# Patient Record
Sex: Female | Born: 2000 | Race: Black or African American | Hispanic: No | Marital: Single | Smoking: Never smoker
Health system: Southern US, Community
[De-identification: ages and names within clinical notes are randomized; demographics above are authoritative.]

## PROBLEM LIST (undated history)

## (undated) DIAGNOSIS — L309 Dermatitis, unspecified: Secondary | ICD-10-CM

---
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
## 2019-04-15 ENCOUNTER — Encounter (HOSPITAL_BASED_OUTPATIENT_CLINIC_OR_DEPARTMENT_OTHER): Payer: Self-pay | Admitting: *Deleted

## 2019-04-15 ENCOUNTER — Emergency Department (HOSPITAL_BASED_OUTPATIENT_CLINIC_OR_DEPARTMENT_OTHER)
Admission: EM | Admit: 2019-04-15 | Discharge: 2019-04-15 | Disposition: A | Discharge status: Home / Self Care | Payer: Medicaid Other | Attending: Emergency Medicine | Admitting: Emergency Medicine

## 2019-04-15 ENCOUNTER — Other Ambulatory Visit: Payer: Self-pay

## 2019-04-15 DIAGNOSIS — M545 Low back pain, unspecified: Secondary | ICD-10-CM

## 2019-04-15 LAB — PREGNANCY, URINE: Preg Test, Ur: NEGATIVE

## 2019-04-15 MED ORDER — CYCLOBENZAPRINE HCL 10 MG PO TABS: 10.0000 mg | ORAL_TABLET | Freq: Two times a day (BID) | ORAL | 0 refills | Status: DC | PRN

## 2019-04-15 NOTE — ED Provider Notes (Signed)
[facility] EMERGENCY DEPARTMENT Provider Note   CSN: [id number] Arrival date & time: 04/15/19  2128     History   Chief Complaint Chief Complaint  Patient presents with  . Back Pain    HPI Nina Monroe is a 18 y.o. female.     The history is provided by the patient.  Back Pain Location:  Lumbar spine Quality:  Aching Radiates to:  Does not radiate Pain severity:  Mild Onset quality:  Gradual Timing:  Intermittent Progression:  Waxing and waning Context: physical stress   Relieved by:  Nothing Worsened by:  Nothing Associated symptoms: no abdominal pain, no abdominal swelling, no bladder incontinence, no bowel incontinence, no chest pain, no dysuria, no fever, no headaches, no leg pain, no perianal numbness, no tingling and no weakness     History reviewed. No pertinent past medical history.  There are no active problems to display for this patient.   History reviewed. No pertinent surgical history.   OB History   No obstetric history on file.      Home Medications    Prior to Admission medications   Medication Sig Start Date End Date Taking? Authorizing Provider  cyclobenzaprine (FLEXERIL) 10 MG tablet Take 1 tablet (10 mg total) by mouth 2 (two) times daily as needed for up to 10 doses for muscle spasms. 04/15/19   Lennice Sites, DO    Family History History reviewed. No pertinent family history.  Social History Social History   Tobacco Use  . Smoking status: Never Smoker  . Smokeless tobacco: Never Used  Substance Use Topics  . Alcohol use: No  . Drug use: No     Allergies   Patient has no known allergies.   Review of Systems Review of Systems  Constitutional: Negative for chills and fever.  HENT: Negative for ear pain and sore throat.   Eyes: Negative for pain and visual disturbance.  Respiratory: Negative for cough and shortness of breath.   Cardiovascular: Negative for chest pain and palpitations.  Gastrointestinal:  Negative for abdominal pain, bowel incontinence and vomiting.  Genitourinary: Negative for bladder incontinence, dysuria and hematuria.  Musculoskeletal: Positive for back pain. Negative for arthralgias.  Skin: Negative for color change and rash.  Neurological: Negative for tingling, seizures, syncope, weakness and headaches.  All other systems reviewed and are negative.    Physical Exam Updated Vital Signs BP (!) 142/86   Pulse 79   Temp 98.4 F (36.9 C) (Oral)   Resp 18   Ht 5\' 3"  (1.6 m)   Wt 72.6 kg   LMP 03/22/2019   SpO2 100%   BMI 28.34 kg/m   Physical Exam Vitals signs and nursing note reviewed.  Constitutional:      General: She is not in acute distress.    Appearance: She is well-developed.  HENT:     Head: Normocephalic and atraumatic.  Eyes:     Conjunctiva/sclera: Conjunctivae normal.  Neck:     Musculoskeletal: Normal range of motion and neck supple. Muscular tenderness present.  Cardiovascular:     Rate and Rhythm: Normal rate and regular rhythm.     Pulses: Normal pulses.     Heart sounds: Normal heart sounds. No murmur.  Pulmonary:     Effort: Pulmonary effort is normal. No respiratory distress.     Breath sounds: Normal breath sounds.  Abdominal:     Palpations: Abdomen is soft.     Tenderness: There is no abdominal tenderness.  Musculoskeletal: Normal range  of motion.        General: Tenderness present.     Comments: Paraspinal lumbar tenderness bilaterally, no midline spinal pain.  Skin:    General: Skin is warm and dry.  Neurological:     General: No focal deficit present.     Mental Status: She is alert and oriented to person, place, and time.     Cranial Nerves: No cranial nerve deficit.     Sensory: No sensory deficit.     Motor: No weakness.     Coordination: Coordination normal.     Gait: Gait normal.     Comments: 5+ out of 5 strength throughout, normal sensation      ED Treatments / Results  Labs (all labs ordered are listed,  but only abnormal results are displayed) Labs Reviewed  PREGNANCY, URINE    EKG None  Radiology No results found.  Procedures Procedures (including critical care time)  Medications Ordered in ED Medications - No data to display   Initial Impression / Assessment and Plan / ED Course  I have reviewed the triage vital signs and the nursing notes.  Pertinent labs & imaging results that were available during my care of the patient were reviewed by me and considered in my medical decision making (see chart for details).        Nina Monroe is a 18 year old female here for low back pain.  Patient with no red flag signs.  No midline spinal pain.  No loss of bowel or bladder.  No saddle anesthesia.  Patient with paraspinal lumbar tenderness.  Likely spasm.  Discharged from ED in good condition.  Recommend Tylenol, Motrin, Flexeril.  Pregnancy test negative.  This chart was dictated using voice recognition software.  Despite best efforts to proofread,  errors can occur which can change the documentation meaning.    Final Clinical Impressions(s) / ED Diagnoses   Final diagnoses:  Low back pain, unspecified back pain laterality, unspecified chronicity, unspecified whether sciatica present    ED Discharge Orders         Ordered    cyclobenzaprine (FLEXERIL) 10 MG tablet  2 times daily PRN     04/15/19 2208           Virgina NorfolkCuratolo, Tiaria Biby, DO 04/15/19 2211

## 2019-04-15 NOTE — ED Triage Notes (Signed)
Pt c/o fall with lower back pain x 1 day , also tested for Covid yesterday w/o symptoms

## 2019-04-22 ENCOUNTER — Other Ambulatory Visit: Payer: Self-pay

## 2019-04-22 ENCOUNTER — Emergency Department (HOSPITAL_COMMUNITY)
Admission: EM | Admit: 2019-04-22 | Discharge: 2019-04-22 | Disposition: A | Discharge status: Home / Self Care | Payer: Medicaid Other | Attending: Emergency Medicine | Admitting: Emergency Medicine

## 2019-04-22 ENCOUNTER — Encounter (HOSPITAL_COMMUNITY): Payer: Self-pay

## 2019-04-22 DIAGNOSIS — L03114 Cellulitis of left upper limb: Secondary | ICD-10-CM

## 2019-04-22 DIAGNOSIS — L2082 Flexural eczema: Secondary | ICD-10-CM | POA: Insufficient documentation

## 2019-04-22 MED ORDER — CEPHALEXIN 250 MG PO CAPS
500.0000 mg | ORAL_CAPSULE | Freq: Once | ORAL | Status: AC
  Administered 2019-04-22: 500 mg via ORAL
  Filled 2019-04-22: qty 2

## 2019-04-22 MED ORDER — DEXAMETHASONE SODIUM PHOSPHATE 10 MG/ML IJ SOLN
10.0000 mg | Freq: Once | INTRAMUSCULAR | Status: AC
  Administered 2019-04-22: 10 mg via INTRAMUSCULAR
  Filled 2019-04-22: qty 1

## 2019-04-22 MED ORDER — CEPHALEXIN 500 MG PO CAPS: 500.0000 mg | ORAL_CAPSULE | Freq: Two times a day (BID) | ORAL | 0 refills | Status: DC

## 2019-04-22 NOTE — ED Triage Notes (Signed)
Pt has had several abscess in the left AC, reports drainage, denies IV drug use, denies fevers

## 2019-04-22 NOTE — ED Notes (Signed)
Pt refused discharge signature. 

## 2019-04-22 NOTE — ED Provider Notes (Signed)
[facility] Provider Note   CSN: [id number] Arrival date & time: 04/22/19  0005     History   Chief Complaint Chief Complaint  Patient presents with  . Abscess    HPI Nina Monroe is a 18 y.o. female.     Patient presents to the emergency department with a chief complaint of rash.  She states that she noticed a rash on her left arm few days ago.  She has developed some sores which are painful.  She does have a history of eczema.  She reports that she has been using hydrocortisone cream with no relief.  She denies any fevers or chills.  Denies any injection drug use.  Denies any other associated symptoms.  The history is provided by the patient. No language interpreter was used.    History reviewed. No pertinent past medical history.  There are no active problems to display for this patient.   History reviewed. No pertinent surgical history.   OB History   No obstetric history on file.      Home Medications    Prior to Admission medications   Medication Sig Start Date End Date Taking? Authorizing Provider  cephALEXin (KEFLEX) 500 MG capsule Take 1 capsule (500 mg total) by mouth 2 (two) times daily. 04/22/19   Roxy HorsemanBrowning, Zamyra Allensworth, PA-C  cyclobenzaprine (FLEXERIL) 10 MG tablet Take 1 tablet (10 mg total) by mouth 2 (two) times daily as needed for up to 10 doses for muscle spasms. 04/15/19   Virgina Norfolkuratolo, Adam, DO    Family History No family history on file.  Social History Social History   Tobacco Use  . Smoking status: Never Smoker  . Smokeless tobacco: Never Used  Substance Use Topics  . Alcohol use: No  . Drug use: No     Allergies   Patient has no known allergies.   Review of Systems Review of Systems  All other systems reviewed and are negative.    Physical Exam Updated Vital Signs BP 128/86   Pulse 91   Temp 98.6 F (37 C) (Oral)   Resp 14   Ht 5\' 2"  (1.575 m)   Wt 68 kg   SpO2 100%   BMI 27.44 kg/m    Physical Exam Vitals signs and nursing note reviewed.  Constitutional:      General: She is not in acute distress.    Appearance: She is well-developed.  HENT:     Head: Normocephalic and atraumatic.  Eyes:     Conjunctiva/sclera: Conjunctivae normal.  Neck:     Musculoskeletal: Neck supple.  Cardiovascular:     Rate and Rhythm: Normal rate.     Heart sounds: No murmur.  Pulmonary:     Effort: Pulmonary effort is normal. No respiratory distress.  Abdominal:     General: There is no distension.  Skin:    General: Skin is warm and dry.     Comments: Eczema with mild superficial infection to the left antecubital fossa, no abscess  Neurological:     Mental Status: She is alert.  Psychiatric:        Mood and Affect: Mood normal.        Behavior: Behavior normal.        Thought Content: Thought content normal.        Judgment: Judgment normal.      ED Treatments / Results  Labs (all labs ordered are listed, but only abnormal results are displayed) Labs Reviewed - No data  to display  EKG None  Radiology No results found.  Procedures Procedures (including critical care time)  Medications Ordered in ED Medications  dexamethasone (DECADRON) injection 10 mg (has no administration in time range)  cephALEXin (KEFLEX) capsule 500 mg (has no administration in time range)     Initial Impression / Assessment and Plan / ED Course  I have reviewed the triage vital signs and the nursing notes.  Pertinent labs & imaging results that were available during my care of the patient were reviewed by me and considered in my medical decision making (see chart for details).        Patient with eczema and mild superficial infection.  No abscess.  Will treat with Keflex.  Will give Decadron to hopefully knock down the eczema.  Final Clinical Impressions(s) / ED Diagnoses   Final diagnoses:  Flexural eczema  Cellulitis of left upper extremity    ED Discharge Orders          Ordered    cephALEXin (KEFLEX) 500 MG capsule  2 times daily     04/22/19 0055           Montine Circle, PA-C 04/22/19 0057    Palumbo, April, MD 04/22/19 5027

## 2019-04-22 NOTE — ED Notes (Signed)
Pt discharged with all belongings. Discharge instructions reviewed with pt, and pt verbalized understanding. Opportunity for questions provided.  

## 2019-05-07 ENCOUNTER — Emergency Department (HOSPITAL_COMMUNITY)
Admission: EM | Admit: 2019-05-07 | Discharge: 2019-05-07 | Disposition: A | Discharge status: Home / Self Care | Payer: Medicaid Other | Attending: Emergency Medicine | Admitting: Emergency Medicine

## 2019-05-07 ENCOUNTER — Other Ambulatory Visit: Payer: Self-pay

## 2019-05-07 DIAGNOSIS — Z20828 Contact with and (suspected) exposure to other viral communicable diseases: Secondary | ICD-10-CM | POA: Insufficient documentation

## 2019-05-07 DIAGNOSIS — J069 Acute upper respiratory infection, unspecified: Secondary | ICD-10-CM | POA: Insufficient documentation

## 2019-05-07 MED ORDER — ONDANSETRON 4 MG PO TBDP: 4.0000 mg | ORAL_TABLET | Freq: Three times a day (TID) | ORAL | 0 refills | Status: DC | PRN

## 2019-05-07 NOTE — ED Provider Notes (Signed)
[facility]-EMERGENCY DEPT Provider Note   CSN: [id number] Arrival date & time: 05/07/19  1259     History   Chief Complaint Chief Complaint  Patient presents with  . Cough  . Sore Throat  . Diarrhea  . Nausea  . Emesis    HPI Nina Monroe is a 18 y.o. female with no significant PMH who presents with sore throat, nausea, and diarrhea.  She says that her symptoms started about 5 days ago.  [facility] and was given amoxicillin for presumed strep throat.  She says that her symptoms have not improved despite taking the amoxicillin.  She has postnasal drip, occasional cough productive of phlegm, sore throat, some nausea and vomiting, and diarrhea, which she describes as having 2 loose stools daily.  She denies fever and shortness of breath.  She has no known sick contacts, including no known exposure to COVID.     HPI  No past medical history on file.  There are no active problems to display for this patient.   No past surgical history on file.   OB History   No obstetric history on file.      Home Medications    Prior to Admission medications   Medication Sig Start Date End Date Taking? Authorizing Provider  cephALEXin (KEFLEX) 500 MG capsule Take 1 capsule (500 mg total) by mouth 2 (two) times daily. 04/22/19   Roxy HorsemanBrowning, Robert, PA-C  cyclobenzaprine (FLEXERIL) 10 MG tablet Take 1 tablet (10 mg total) by mouth 2 (two) times daily as needed for up to 10 doses for muscle spasms. 04/15/19   Curatolo, Adam, DO  ondansetron (ZOFRAN ODT) 4 MG disintegrating tablet Take 1 tablet (4 mg total) by mouth every 8 (eight) hours as needed for nausea or vomiting. 05/07/19   Winfrey, Harlen LabsAmanda C, MD    Family History No family history on file.  Social History Social History   Tobacco Use  . Smoking status: Never Smoker  . Smokeless tobacco: Never Used  Substance Use Topics  . Alcohol use: No  . Drug use: No      Allergies   Patient has no known allergies.   Review of Systems Review of Systems  Constitutional: Positive for fatigue. Negative for activity change, appetite change and fever.  HENT: Positive for congestion, postnasal drip and sore throat. Negative for ear pain.   Respiratory: Positive for cough. Negative for shortness of breath.   Cardiovascular: Negative for chest pain.  Gastrointestinal: Positive for diarrhea, nausea and vomiting. Negative for abdominal pain.  Genitourinary: Negative for dysuria and pelvic pain.  Musculoskeletal: Negative for back pain and myalgias.  Skin: Negative for rash.  Neurological: Negative for weakness and headaches.  Psychiatric/Behavioral: The patient is not nervous/anxious.      Physical Exam Updated Vital Signs There were no vitals taken for this visit.  Physical Exam Constitutional:      General: She is not in acute distress.    Appearance: She is well-developed and normal weight. She is not ill-appearing.  HENT:     Head: Normocephalic and atraumatic.     Right Ear: Tympanic membrane and ear canal normal. No drainage or tenderness. Tympanic membrane is not erythematous.     Left Ear: Tympanic membrane and ear canal normal. No drainage or tenderness. Tympanic membrane is not erythematous.     Nose: Congestion and rhinorrhea present.     Mouth/Throat:     Mouth: Mucous  membranes are moist. No oral lesions.     Pharynx: Uvula midline. No pharyngeal swelling or posterior oropharyngeal erythema.     Tonsils: No tonsillar exudate or tonsillar abscesses.  Eyes:     Conjunctiva/sclera: Conjunctivae normal.     Pupils: Pupils are equal, round, and reactive to light.  Neck:     Musculoskeletal: Normal range of motion and neck supple.  Cardiovascular:     Rate and Rhythm: Normal rate and regular rhythm.     Heart sounds: Normal heart sounds.  Pulmonary:     Effort: Pulmonary effort is normal.     Breath sounds: Normal breath sounds.   Abdominal:     General: Bowel sounds are normal.     Palpations: Abdomen is soft.  Lymphadenopathy:     Cervical: No cervical adenopathy.  Skin:    General: Skin is warm and dry.     Findings: No rash.  Neurological:     General: No focal deficit present.     Mental Status: She is alert and oriented to person, place, and time.  Psychiatric:        Mood and Affect: Mood normal.        Behavior: Behavior normal.      ED Treatments / Results  Labs (all labs ordered are listed, but only abnormal results are displayed) Labs Reviewed  NOVEL CORONAVIRUS, NAA (HOSPITAL ORDER, SEND-OUT TO REF LAB)    EKG None  Radiology No results found.  Procedures Procedures (including critical care time)  Medications Ordered in ED Medications - No data to display   Initial Impression / Assessment and Plan / ED Course  I have reviewed the triage vital signs and the nursing notes.  Pertinent labs & imaging results that were available during my care of the patient were reviewed by me and considered in my medical decision making (see chart for details).       Patient symptoms and physical exam are most consistent with a viral URI.  It is possible that this is COVID, so we will send out a COVID test.  Patient was counseled that the best treatment will be symptom control, so we will send in Zofran to her pharmacy and recommend that she use hot tea with honey to soothe her sore throat.  We will also provide her with a note to excuse her from work for the next 3 days.  Final Clinical Impressions(s) / ED Diagnoses   Final diagnoses:  Viral upper respiratory tract infection    ED Discharge Orders         Ordered    ondansetron (ZOFRAN ODT) 4 MG disintegrating tablet  Every 8 hours PRN     05/07/19 1345           Kathrene Alu, MD 05/07/19 1354    Carmin Muskrat, MD 05/07/19 1410

## 2019-05-07 NOTE — ED Triage Notes (Signed)
Pt BIB EMS from hotel. [place]. Pt c/o N/V/D, cough, sore throat x 5 days. Pt reports taking antibiotics x 2 days for strept throat.   122/97 102 HR 17 Resp 98.1 temp 98% RA

## 2019-05-08 LAB — NOVEL CORONAVIRUS, NAA (HOSP ORDER, SEND-OUT TO REF LAB; TAT 18-24 HRS): SARS-CoV-2, NAA: NOT DETECTED

## 2019-12-19 ENCOUNTER — Encounter (HOSPITAL_BASED_OUTPATIENT_CLINIC_OR_DEPARTMENT_OTHER): Payer: Self-pay | Admitting: *Deleted

## 2019-12-19 ENCOUNTER — Emergency Department (HOSPITAL_BASED_OUTPATIENT_CLINIC_OR_DEPARTMENT_OTHER)
Admission: EM | Admit: 2019-12-19 | Discharge: 2019-12-20 | Disposition: A | Discharge status: Home / Self Care | Payer: Medicaid Other | Attending: Emergency Medicine | Admitting: Emergency Medicine

## 2019-12-19 ENCOUNTER — Other Ambulatory Visit: Payer: Self-pay

## 2019-12-19 DIAGNOSIS — R05 Cough: Secondary | ICD-10-CM | POA: Diagnosis present

## 2019-12-19 DIAGNOSIS — Z20822 Contact with and (suspected) exposure to covid-19: Secondary | ICD-10-CM | POA: Insufficient documentation

## 2019-12-19 DIAGNOSIS — J069 Acute upper respiratory infection, unspecified: Secondary | ICD-10-CM | POA: Diagnosis not present

## 2019-12-19 HISTORY — DX: Dermatitis, unspecified: L30.9

## 2019-12-19 MED ORDER — ONDANSETRON 4 MG PO TBDP
8.0000 mg | ORAL_TABLET | Freq: Once | ORAL | Status: AC
  Administered 2019-12-19: 8 mg via ORAL
  Filled 2019-12-19: qty 2

## 2019-12-19 NOTE — ED Triage Notes (Addendum)
Pt reports not feeling well for several days. States she has had cough, runny nose, "mucous in my throat", temp 100.4, and diarrhea since Wednesday. 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

## 2019-12-19 NOTE — ED Provider Notes (Signed)
[place] [facility] Provider Note: Georgena Spurling, MD, FACEP  CSN: [id number] MRN: [id number] ARRIVAL: 12/19/19 at [place]: [place]  Cough   HISTORY OF PRESENT ILLNESS  12/19/19 11:23 PM Braxton Weisbecker is a 19 y.o. female who complains of 3 days of viral illness type symptoms.  Specifically she has had a cough, rhinorrhea, nasal congestion, postnasal drip, fever to 100.4 and diarrhea.  She had some body aches early in the course of the illness but none now.  Her nausea presently is mild.  Her principal complaint currently is cough.  She has minimal shortness of breath.  Her symptoms are not severe but her mother wanted her to be evaluated.   Past Medical History:  Diagnosis Date  . Eczema     History reviewed. No pertinent surgical history.  No family history on file.  Social History   Tobacco Use  . Smoking status: Never Smoker  . Smokeless tobacco: Never Used  Substance Use Topics  . Alcohol use: No  . Drug use: No    Prior to Admission medications   Medication Sig Start Date End Date Taking? Authorizing Provider  ondansetron (ZOFRAN ODT) 8 MG disintegrating tablet Take 1 tablet (8 mg total) by mouth every 8 (eight) hours as needed for nausea or vomiting. 12/20/19   Alleyne Lac, Jenny Reichmann, MD    Allergies Patient has no known allergies.   REVIEW OF SYSTEMS  Negative except as noted here or in the History of Present Illness.   PHYSICAL EXAMINATION  Initial Vital Signs Blood pressure 124/78, pulse 77, temperature 98.5 F (36.9 C), temperature source Oral, resp. rate 18, height 5' 2"  (1.575 m), weight 74.2 kg, last menstrual period 12/12/2019, SpO2 100 %.  Examination General: Well-developed, well-nourished female in no acute distress; appearance consistent with age of record HENT: normocephalic; atraumatic; no pharyngeal erythema or exudate Eyes: pupils equal, round and reactive to light; extraocular muscles intact Neck: supple Heart: regular  rate and rhythm Lungs: clear to auscultation bilaterally Abdomen: soft; nondistended; nontender; bowel sounds present Extremities: No deformity; full range of motion Neurologic: Awake, alert and oriented; motor function intact in all extremities and symmetric; no facial droop Skin: Warm and dry Psychiatric: Normal mood and affect   RESULTS  Summary of this visit's results, reviewed and interpreted by myself:   EKG Interpretation  Date/Time:    Ventricular Rate:    PR Interval:    QRS Duration:   QT Interval:    QTC Calculation:   R Axis:     Text Interpretation:        Laboratory Studies: Results for orders placed or performed during the hospital encounter of 12/19/19 (from the past 24 hour(s))  SARS Coronavirus 2 Ag (30 min TAT) - Nasal Swab (BD Veritor Kit)     Status: None   Collection Time: 12/19/19 11:32 PM   Specimen: Nasal Swab (BD Veritor Kit)  Result Value Ref Range   SARS Coronavirus 2 Ag NEGATIVE NEGATIVE   Imaging Studies: No results found.  ED COURSE and MDM  Nursing notes, initial and subsequent vitals signs, including pulse oximetry, reviewed and interpreted by myself.  Vitals:   12/19/19 2234 12/19/19 2238  BP:  124/78  Pulse:  77  Resp:  18  Temp:  98.5 F (36.9 C)  TempSrc:  Oral  SpO2:  100%  Weight: 74.2 kg   Height: 5' 2"  (1.575 m)    Medications  ondansetron (ZOFRAN-ODT) disintegrating tablet 8 mg (8 mg  Oral Given 12/19/19 2337)   Patient negative for Covid antigen test, PCR confirmatory test ordered.  Lyndy Russman was evaluated in Emergency Department on 12/20/2019 for the symptoms described in the history of present illness. She was evaluated in the context of the global COVID-19 pandemic, which necessitated consideration that the patient might be at risk for infection with the SARS-CoV-2 virus that causes COVID-19. Institutional protocols and algorithms that pertain to the evaluation of patients at risk for COVID-19 are in a state of  rapid change based on information released by regulatory bodies including the CDC and federal and state organizations. These policies and algorithms were followed during the patient's care in the ED.   PROCEDURES  Procedures   ED DIAGNOSES     ICD-10-CM   1. Viral URI with cough  J06.9        Michaelpaul Apo, MD 12/20/19 4970

## 2019-12-20 LAB — SARS CORONAVIRUS 2 (TAT 6-24 HRS): SARS Coronavirus 2: NEGATIVE

## 2019-12-20 LAB — SARS CORONAVIRUS 2 AG (30 MIN TAT): SARS Coronavirus 2 Ag: NEGATIVE

## 2019-12-20 MED ORDER — ONDANSETRON 8 MG PO TBDP: 8.0000 mg | ORAL_TABLET | Freq: Three times a day (TID) | ORAL | 0 refills | Status: AC | PRN

## 2019-12-20 NOTE — ED Notes (Signed)
Patient verbalizes understanding of discharge instructions. Opportunity for questioning and answers were provided. pt discharged from ED ambulatory.   

## 2020-01-15 ENCOUNTER — Emergency Department (HOSPITAL_BASED_OUTPATIENT_CLINIC_OR_DEPARTMENT_OTHER): Payer: Medicaid Other

## 2020-01-15 ENCOUNTER — Other Ambulatory Visit: Payer: Self-pay

## 2020-01-15 ENCOUNTER — Encounter (HOSPITAL_BASED_OUTPATIENT_CLINIC_OR_DEPARTMENT_OTHER): Payer: Self-pay

## 2020-01-15 ENCOUNTER — Emergency Department (HOSPITAL_BASED_OUTPATIENT_CLINIC_OR_DEPARTMENT_OTHER)
Admission: EM | Admit: 2020-01-15 | Discharge: 2020-01-15 | Disposition: A | Discharge status: Home / Self Care | Payer: Medicaid Other | Attending: Emergency Medicine | Admitting: Emergency Medicine

## 2020-01-15 DIAGNOSIS — N3000 Acute cystitis without hematuria: Secondary | ICD-10-CM

## 2020-01-15 DIAGNOSIS — R1031 Right lower quadrant pain: Secondary | ICD-10-CM

## 2020-01-15 LAB — URINALYSIS, ROUTINE W REFLEX MICROSCOPIC
Bilirubin Urine: NEGATIVE
Glucose, UA: NEGATIVE mg/dL
Hgb urine dipstick: NEGATIVE
Ketones, ur: 15 mg/dL — AB
Nitrite: NEGATIVE
Protein, ur: NEGATIVE mg/dL
Specific Gravity, Urine: 1.025 (ref 1.005–1.030)
pH: 7.5 (ref 5.0–8.0)

## 2020-01-15 LAB — COMPREHENSIVE METABOLIC PANEL
ALT: 12 U/L (ref 0–44)
AST: 18 U/L (ref 15–41)
Albumin: 4.3 g/dL (ref 3.5–5.0)
Alkaline Phosphatase: 59 U/L (ref 38–126)
Anion gap: 8 (ref 5–15)
BUN: 11 mg/dL (ref 6–20)
CO2: 26 mmol/L (ref 22–32)
Calcium: 9.4 mg/dL (ref 8.9–10.3)
Chloride: 107 mmol/L (ref 98–111)
Creatinine, Ser: 0.69 mg/dL (ref 0.44–1.00)
GFR calc Af Amer: >60 mL/min (ref >60)
GFR calc non Af Amer: >60 mL/min (ref >60)
Glucose, Bld: 99 mg/dL (ref 70–99)
Potassium: 4.4 mmol/L (ref 3.5–5.1)
Sodium: 141 mmol/L (ref 135–145)
Total Bilirubin: 1.1 mg/dL (ref 0.3–1.2)
Total Protein: 7.5 g/dL (ref 6.5–8.1)

## 2020-01-15 LAB — URINALYSIS, MICROSCOPIC (REFLEX): RBC / HPF: NONE SEEN RBC/hpf (ref 0–5)

## 2020-01-15 LAB — CBC WITH DIFFERENTIAL/PLATELET
Abs Immature Granulocytes: 0.02 10*3/uL (ref 0.00–0.07)
Basophils Absolute: 0.1 10*3/uL (ref 0.0–0.1)
Basophils Relative: 1 %
Eosinophils Absolute: 0.1 10*3/uL (ref 0.0–0.5)
Eosinophils Relative: 2 %
HCT: 37.8 % (ref 36.0–46.0)
Hemoglobin: 12.8 g/dL (ref 12.0–15.0)
Immature Granulocytes: 0 %
Lymphocytes Relative: 32 %
Lymphs Abs: 1.9 10*3/uL (ref 0.7–4.0)
MCH: 27.5 pg (ref 26.0–34.0)
MCHC: 33.9 g/dL (ref 30.0–36.0)
MCV: 81.3 fL (ref 80.0–100.0)
Monocytes Absolute: 0.5 10*3/uL (ref 0.1–1.0)
Monocytes Relative: 9 %
Neutro Abs: 3.5 10*3/uL (ref 1.7–7.7)
Neutrophils Relative %: 56 %
Platelets: 345 10*3/uL (ref 150–400)
RBC: 4.65 MIL/uL (ref 3.87–5.11)
RDW: 14.7 % (ref 11.5–15.5)
WBC: 6.1 10*3/uL (ref 4.0–10.5)
nRBC: 0 % (ref 0.0–0.2)

## 2020-01-15 LAB — PREGNANCY, URINE: Preg Test, Ur: NEGATIVE

## 2020-01-15 LAB — LIPASE, BLOOD: Lipase: 25 U/L (ref 11–51)

## 2020-01-15 MED ORDER — CEPHALEXIN 500 MG PO CAPS: 500.0000 mg | ORAL_CAPSULE | Freq: Four times a day (QID) | ORAL | 0 refills | Status: AC

## 2020-01-15 MED ORDER — IBUPROFEN 800 MG PO TABS: 800.0000 mg | ORAL_TABLET | Freq: Three times a day (TID) | ORAL | 0 refills | Status: AC | PRN

## 2020-01-15 MED ORDER — IOHEXOL 300 MG/ML  SOLN
100.0000 mL | Freq: Once | INTRAMUSCULAR | Status: AC | PRN
  Administered 2020-01-15: 100 mL via INTRAVENOUS

## 2020-01-15 NOTE — Discharge Instructions (Signed)
You were seen in emergency department today with right-sided chest/abdominal pain.  You had some tenderness in your right lower abdomen.  Your CT scan and lab work were normal.  Please take ibuprofen as needed for your pain symptoms and follow closely with the primary care doctor.  Return to the emergency department if you develop new or suddenly worsening symptoms.
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
## 2020-01-15 NOTE — ED Provider Notes (Signed)
Emergency Department Provider Note   I have reviewed the triage vital signs and the nursing notes.   HISTORY  Chief Complaint No chief complaint on file.   HPI Nina Monroe is a 19 y.o. female with PMH of Eczema presents emergency department for evaluation of right flank type pain which returned over the past 2 days.  She began having similar type pain which she describes a throbbing 4 years ago after falling.  She states this pain is different and that it seems more severe and it is not improving with Tylenol.  She denies vomiting or diarrhea.  No fevers.  No dysuria, hesitancy, urgency.  She is not feeling pleuritic type pain or chest discomfort.  She is not feeling short of breath.  With pain seeming different from prior and not going away with conservative management she states that her mom pushed for her to be seen in the emergency department.  Her last menstrual cycle was 1 month ago.  She denies any vaginal bleeding or discharge currently.   Past Medical History:  Diagnosis Date  . Eczema     There are no problems to display for this patient.   History reviewed. No pertinent surgical history.  Allergies Patient has no known allergies.  No family history on file.  Social History Social History   Tobacco Use  . Smoking status: Never Smoker  . Smokeless tobacco: Never Used  Substance Use Topics  . Alcohol use: No  . Drug use: No    Review of Systems  Constitutional: No fever/chills Eyes: No visual changes. ENT: No sore throat. Cardiovascular: Denies chest pain. Respiratory: Denies shortness of breath. Gastrointestinal: Positive right flank/abdominal pain.  No nausea, no vomiting.  No diarrhea.  No constipation. Genitourinary: Negative for dysuria. Musculoskeletal: Negative for back pain. Skin: Negative for rash. Neurological: Negative for headaches, focal weakness or numbness.  10-point ROS otherwise  negative.  ____________________________________________   PHYSICAL EXAM:  VITAL SIGNS: ED Triage Vitals  Enc Vitals Group     BP 01/15/20 1652 (!) 153/96     Pulse Rate 01/15/20 1652 98     Resp 01/15/20 1652 18     Temp 01/15/20 1652 98.6 F (37 C)     Temp Source 01/15/20 1652 Oral     SpO2 01/15/20 1652 99 %     Weight 01/15/20 1652 170 lb (77.1 kg)     Height 01/15/20 1652 5\' 3"  (1.6 m)   Constitutional: Alert and oriented. Well appearing and in no acute distress. Eyes: Conjunctivae are normal.  Head: Atraumatic. Nose: No congestion/rhinnorhea. Mouth/Throat: Mucous membranes are moist. Neck: No stridor.  Cardiovascular: Normal rate, regular rhythm. Good peripheral circulation. Grossly normal heart sounds.   Respiratory: Normal respiratory effort.  No retractions. Lungs CTAB. Gastrointestinal: Soft with focal tenderness in the right lower quadrant without rebound or guarding.  No right upper quadrant tenderness. No distention.  Musculoskeletal: No gross deformities of extremities.  No tenderness over the right lateral chest wall, posterior chest, anterior chest.  Neurologic:  Normal speech and language. Skin:  Skin is warm, dry and intact. No rash noted.   ____________________________________________   LABS (all labs ordered are listed, but only abnormal results are displayed)  Labs Reviewed  URINALYSIS, ROUTINE W REFLEX MICROSCOPIC - Abnormal; Notable for the following components:      Result Value   APPearance CLOUDY (*)    Ketones, ur 15 (*)    Leukocytes,Ua TRACE (*)    All other components within  normal limits  URINALYSIS, MICROSCOPIC (REFLEX) - Abnormal; Notable for the following components:   Bacteria, UA RARE (*)    All other components within normal limits  PREGNANCY, URINE  COMPREHENSIVE METABOLIC PANEL  LIPASE, BLOOD  CBC WITH DIFFERENTIAL/PLATELET   ____________________________________________  RADIOLOGY  CT ABDOMEN PELVIS W CONTRAST  Result  Date: 01/15/2020 CLINICAL DATA:  Right lower quadrant abdominal pain EXAM: CT ABDOMEN AND PELVIS WITH CONTRAST TECHNIQUE: Multidetector CT imaging of the abdomen and pelvis was performed using the standard protocol following bolus administration of intravenous contrast. CONTRAST:  126mL OMNIPAQUE IOHEXOL 300 MG/ML  SOLN COMPARISON:  None FINDINGS: Lower chest: Lung bases are clear. Hepatobiliary: Liver and biliary tree are unremarkable. Pancreas: Pancreas is normal without focal lesion or ductal dilation. No peripancreatic inflammation. Spleen: Spleen is normal size without focal lesion. Adrenals/Urinary Tract: Adrenal glands are normal. Small low-density lesion in the in the upper pole the left kidney too small for definitive characterization but likely a small cyst, similar 5 mm lesion in the right kidney. Urinary bladder is unremarkable. No hydronephrosis. Symmetric renal enhancement. Stomach/Bowel: Opacity of mesenteric fat makes individual bowel assessment difficult. No perienteric stranding. Stomach is under distended. Is The appendix is normal. Colon is stool filled. Stool like material in the distal small bowel as well. Vascular/Lymphatic: Vascular structures in the abdomen are patent. No adenopathy in the retroperitoneum or upper abdomen. Reproductive: CT appearance of the uterus and adnexa is unremarkable. No pelvic lymphadenopathy. Other: No abdominal wall hernia or abnormality. No abdominopelvic ascites. Musculoskeletal: No acute or significant osseous findings. IMPRESSION: 1. No acute abnormality in the abdomen or pelvis to explain the patient's symptoms. 2. The appendix is normal. 3. Stool like material in the distal small bowel and colon, this may reflect delayed enteric and colonic transit/constipation. Electronically Signed   By: Zetta Bills M.D.   On: 01/15/2020 20:22    ____________________________________________   PROCEDURES  Procedure(s) performed:    Procedures  None ____________________________________________   INITIAL IMPRESSION / ASSESSMENT AND PLAN / ED COURSE  Pertinent labs & imaging results that were available during my care of the patient were reviewed by me and considered in my medical decision making (see chart for details).   Patient presents emergency department with right flank, throbbing pain which is in a similar location to prior pain episodes but different in certain circumstances.  She is not describing pleuritic pain.  Her oxygen saturation, heart rate, blood pressure are normal.  She is afebrile.  She is well-appearing but on abdominal exam she has focal right lower quadrant tenderness.  Differential includes appendicitis with referral of pain to the right flank.  Less likely ovarian cyst or PID given history. Doubt ureterolithiasis. Will send for CT to r/o appendicitis given her exam.   Patient's labs reviewed. CT with no acute findings upon review. Plan for discharge with close PCP follow up plan.  ____________________________________________  FINAL CLINICAL IMPRESSION(S) / ED DIAGNOSES  Final diagnoses:  RLQ abdominal pain  Acute cystitis without hematuria     MEDICATIONS GIVEN DURING THIS VISIT:  Medications  iohexol (OMNIPAQUE) 300 MG/ML solution 100 mL (100 mLs Intravenous Contrast Given 01/15/20 1958)     NEW OUTPATIENT MEDICATIONS STARTED DURING THIS VISIT:  Discharge Medication List as of 01/15/2020  8:28 PM    START taking these medications   Details  ibuprofen (ADVIL) 800 MG tablet Take 1 tablet (800 mg total) by mouth every 8 (eight) hours as needed for moderate pain., Starting Fri 01/15/2020, Print  Note:  This document was prepared using Dragon voice recognition software and may include unintentional dictation errors.  Alona Bene, MD, [facility]    Hailley Byers, Arlyss Repress, MD 01/16/20 [phone]

## 2020-03-29 ENCOUNTER — Other Ambulatory Visit: Payer: Self-pay

## 2020-03-29 ENCOUNTER — Emergency Department (HOSPITAL_BASED_OUTPATIENT_CLINIC_OR_DEPARTMENT_OTHER)
Admission: EM | Admit: 2020-03-29 | Discharge: 2020-03-29 | Disposition: A | Discharge status: Home / Self Care | Payer: Medicaid Other | Attending: Emergency Medicine | Admitting: Emergency Medicine

## 2020-03-29 ENCOUNTER — Emergency Department (HOSPITAL_BASED_OUTPATIENT_CLINIC_OR_DEPARTMENT_OTHER): Payer: Medicaid Other

## 2020-03-29 ENCOUNTER — Encounter (HOSPITAL_BASED_OUTPATIENT_CLINIC_OR_DEPARTMENT_OTHER): Payer: Self-pay

## 2020-03-29 DIAGNOSIS — J069 Acute upper respiratory infection, unspecified: Secondary | ICD-10-CM | POA: Diagnosis not present

## 2020-03-29 DIAGNOSIS — R05 Cough: Secondary | ICD-10-CM | POA: Diagnosis present

## 2020-03-29 DIAGNOSIS — Z20822 Contact with and (suspected) exposure to covid-19: Secondary | ICD-10-CM | POA: Insufficient documentation

## 2020-03-29 DIAGNOSIS — J029 Acute pharyngitis, unspecified: Secondary | ICD-10-CM | POA: Diagnosis not present

## 2020-03-29 LAB — SARS CORONAVIRUS 2 BY RT PCR (HOSPITAL ORDER, PERFORMED IN ~~LOC~~ HOSPITAL LAB): SARS Coronavirus 2: NEGATIVE

## 2020-03-29 LAB — GROUP A STREP BY PCR: Group A Strep by PCR: NOT DETECTED

## 2020-03-29 MED ORDER — DEXAMETHASONE SODIUM PHOSPHATE 10 MG/ML IJ SOLN
10.0000 mg | Freq: Once | INTRAMUSCULAR | Status: AC
  Administered 2020-03-29: 10 mg via INTRAMUSCULAR
  Filled 2020-03-29: qty 1

## 2020-03-29 NOTE — ED Triage Notes (Signed)
C/o flu like sx x 1 week-NAD-steady gait 

## 2020-03-29 NOTE — ED Provider Notes (Signed)
Medical screening examination/treatment/procedure(s) were conducted as a shared visit with non-physician practitioner(s) and myself.  I personally evaluated the patient during the encounter.    18yF with sore throat. Mild tonsillitis on exam. Uvula midline. Somewhat gravely voice. Handling secretions. Neck supple. No stridor. No nodes. Will check for strep. Abx if positive. Symptomatic tx otherwise. Exam reassuring and I have a very low suspicion for serious deep space head/neck infection at this time.    Raeford Razor, MD 03/29/20 1759

## 2020-03-29 NOTE — ED Notes (Signed)
Pt given ginger ale.

## 2020-03-29 NOTE — ED Provider Notes (Signed)
[facility] EMERGENCY DEPARTMENT Provider Note   CSN: [id number] Arrival date & time: 03/29/20  1625     History Chief Complaint  Patient presents with  . Cough    Nina Monroe is a 19 y.o. female.  HPI 19 year old female with a history of eczema presents to the ER for a 1 week history of cough, sore throat, and gradual voice change.  She states that she feels like her "throat is closing up".  She states that she gets strep throat frequently and tonsillitis.  She states that this feels slightly different though however, states that she feels like her voice has never gotten this low and she also has an associated productive cough with green sputum.  She also states that when she has been trying to drink liquids, that she has been throwing it up.  She denies any fevers, chills, body aches, drooling, tongue swelling, shortness of breath, chest pain, abdominal pain.  Chart review shows she had presented to the ED in July of last year with similar complaints.  No sick contacts.  No known allergies, no recent changes to soaps, new foods etc.  She has not been vaccinated for Covid.    Past Medical History:  Diagnosis Date  . Eczema     There are no problems to display for this patient.   History reviewed. No pertinent surgical history.   OB History   No obstetric history on file.     No family history on file.  Social History   Tobacco Use  . Smoking status: Never Smoker  . Smokeless tobacco: Never Used  Vaping Use  . Vaping Use: Never used  Substance Use Topics  . Alcohol use: No  . Drug use: No    Home Medications Prior to Admission medications   Medication Sig Start Date End Date Taking? Authorizing Provider  ibuprofen (ADVIL) 800 MG tablet Take 1 tablet (800 mg total) by mouth every 8 (eight) hours as needed for moderate pain. 01/15/20   Long, Wonda Olds, MD  ondansetron (ZOFRAN ODT) 8 MG disintegrating tablet Take 1 tablet (8 mg total) by mouth every 8  (eight) hours as needed for nausea or vomiting. 12/20/19   Molpus, Jenny Reichmann, MD    Allergies    Patient has no known allergies.  Review of Systems   Review of Systems  Constitutional: Negative for chills and fever.  HENT: Positive for sore throat, trouble swallowing and voice change. Negative for drooling, ear pain, mouth sores, rhinorrhea, sinus pressure and sinus pain.   Eyes: Negative for pain and visual disturbance.  Respiratory: Positive for cough. Negative for shortness of breath.   Cardiovascular: Negative for chest pain and palpitations.  Gastrointestinal: Positive for nausea and vomiting. Negative for abdominal pain.  Genitourinary: Negative for dysuria and hematuria.  Musculoskeletal: Negative for arthralgias and back pain.  Skin: Negative for color change and rash.  Neurological: Negative for seizures and syncope.  All other systems reviewed and are negative.   Physical Exam Updated Vital Signs BP (!) 153/70 (BP Location: Left Arm)   Pulse (!) 112   Temp 99.3 F (37.4 C) (Oral)   Resp 18   LMP 03/15/2020   SpO2 100%   Physical Exam Vitals and nursing note reviewed.  Constitutional:      General: She is not in acute distress.    Appearance: Normal appearance. She is well-developed. She is not ill-appearing, toxic-appearing or diaphoretic.  HENT:     Head: Normocephalic and atraumatic.  Right Ear: Tympanic membrane normal.     Left Ear: Tympanic membrane normal.     Mouth/Throat:     Mouth: Mucous membranes are moist.     Pharynx: Oropharynx is clear.     Comments: Oropharynx non erythematous without exudates, uvula midline, no unilateral tonsillar swelling, tongue normal size and midline, no sublingual/submandibular swellimg, tolerating secretions well, no muffled voice. Mild erythema to tonsils bilaterally.     Eyes:     Conjunctiva/sclera: Conjunctivae normal.  Cardiovascular:     Rate and Rhythm: Normal rate and regular rhythm.     Pulses: Normal pulses.       Heart sounds: Normal heart sounds. No murmur heard.   Pulmonary:     Effort: Pulmonary effort is normal. No respiratory distress.     Breath sounds: Normal breath sounds.  Abdominal:     Palpations: Abdomen is soft.     Tenderness: There is no abdominal tenderness.  Musculoskeletal:        General: No swelling or tenderness.     Cervical back: No rigidity or tenderness.  Skin:    General: Skin is warm and dry.     Findings: No bruising, erythema or rash.  Neurological:     General: No focal deficit present.     Mental Status: She is alert and oriented to person, place, and time.  Psychiatric:        Mood and Affect: Mood normal.        Behavior: Behavior normal.     ED Results / Procedures / Treatments   Labs (all labs ordered are listed, but only abnormal results are displayed) Labs Reviewed  GROUP A STREP BY PCR  SARS CORONAVIRUS 2 BY RT PCR (HOSPITAL ORDER, PERFORMED IN [facility])    EKG None  Radiology DG Chest Portable 1 View  Result Date: 03/29/2020 CLINICAL DATA:  Cough and sore throat EXAM: PORTABLE CHEST 1 VIEW COMPARISON:  07/09/2015 FINDINGS: The heart size and mediastinal contours are within normal limits. Both lungs are clear. The visualized skeletal structures are unremarkable. IMPRESSION: No active disease. Electronically Signed   By: Jasmine Pang M.D.   On: 03/29/2020 18:16    Procedures Procedures (including critical care time)  Medications Ordered in ED Medications  dexamethasone (DECADRON) injection 10 mg (10 mg Intramuscular Given 03/29/20 1756)    ED Course  I have reviewed the triage vital signs and the nursing notes.  Pertinent labs & imaging results that were available during my care of the patient were reviewed by me and considered in my medical decision making (see chart for details).    MDM Rules/Calculators/A&P                         19 year old female with sore throat, pain with swallowing, mild voice change  over the last week On presentation, the patient is alert and oriented, nontoxic-appearing, in no acute distress, tolerating secretions well, speaking without difficulty.  She has a small lisp at baseline, but she states that this is unchanged.  She states that she feels like her voice is deeper than normal.  She reports sore throat and difficulty swallowing due to the pain.  She has also had some episodes of nonbloody nonbilious vomiting.  Vitals in the ER are overall reassuring, she is afebrile, normal O2 sats, respirations normal.  She is slightly tachycardic and hypertensive with a blood pressure 153/70 and a pulse of 112.  On reevaluation,  this improved throughout the ED course.  Physical exam without evidence of peritonsillar abscess, retropharyngeal abscess, no tripoding, drooling, uvula midline, no tonsillar exudate.  She has had a similar presentation with also complaints of throat swelling and difficulty swallowing last July.  Patient given 10 of Decadron injection here in the ED today.  Chest x-ray without acute abnormalities, no evidence of pneumonia.  Strep negative.  Normal tongue size, no sublingual/submandibular swelling.  No evidence of dental abscess.  Given these findings, will order chest x-ray, Covid and strep. I do not think she needs additional CT imaging at this time.  Suspect that this is a viral URI and of the sore throat.  =COVID pending.  Patient instructed to quarantine until she finds out the results of her Covid test.  Instructed to quarantine an additional 7 to 10 days if she is positive.  She is overall reassured by the work-up.  Doubt peritonsillar/retropharyngeal abscess at this time.  I encouraged the patient to establish with a PCP.  Strict return precautions given.  All the patient's questions have been answered to her satisfaction, she voices understanding and is agreeable to the plan.  At this stage in the ED course, the patient has been medically screened and is stable for  discharge.   Patient was seen and evaluated by Dr. Juleen China who is agreeable to the above plan.   Final Clinical Impression(s) / ED Diagnoses Final diagnoses:  Viral URI with cough  Sore throat    Rx / DC Orders ED Discharge Orders    None       Leone Brand 03/29/20 1836    Raeford Razor, MD 03/30/20 1759

## 2020-03-29 NOTE — Discharge Instructions (Addendum)
Your work-up today was overall reassuring.  Your chest x-ray did not show evidence of pneumonia, your strep test was negative and your Covid was negative as well.  Please make sure to return to the ER if you have worsening sore throat, drooling, fevers, chills or any other worsening concerning symptoms.  You may use salt water gargles, throat lozenges for symptomatic management of your sore throat.  Please follow-up with a primary care provider if your symptoms not improved.  Please call the phone number on your discharge paperwork in order to establish with a primary care doctor.

## 2021-01-06 IMAGING — CT CT ABD-PELV W/ CM
2 of 4 series · 16 of 46 positions shown, 18 images · IV contrast (Omnipaque)
Comparison: None

CLINICAL DATA: Right lower quadrant abdominal pain

EXAM:
CT ABDOMEN AND PELVIS WITH CONTRAST
TECHNIQUE: Multidetector CT imaging of the abdomen and pelvis was performed
using the standard protocol following bolus administration of
intravenous contrast.
CONTRAST:  100mL OMNIPAQUE IOHEXOL 300 MG/ML  SOLN

[Series 2: axial st · axial · 0.68mm/px · z∈[-275,+155]mm · 13 of 94 slices shown, 15 images]
[im 4/94  soft-tissue]
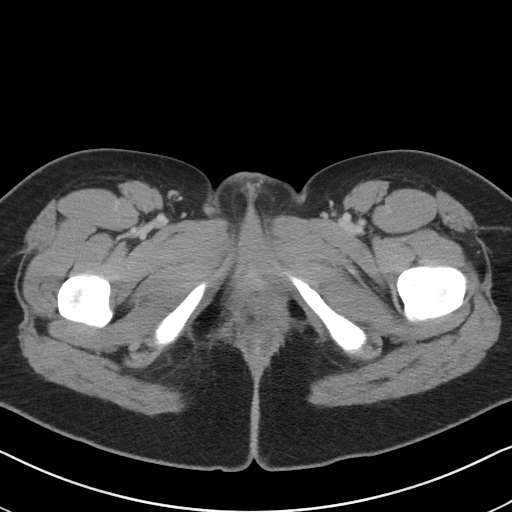
[im 4/94  bone]
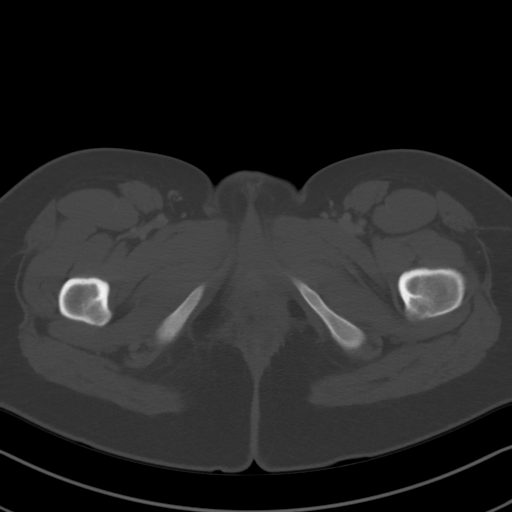
[im 12/94  soft-tissue]
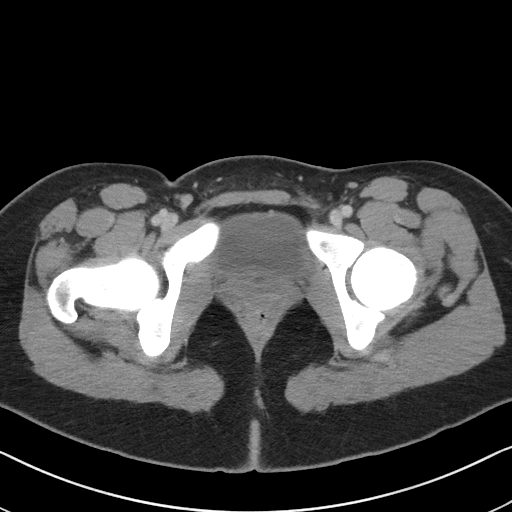
[im 20/94  soft-tissue]
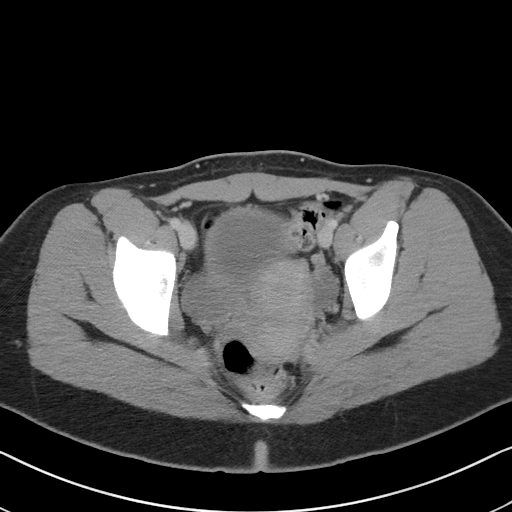
[im 28/94  soft-tissue]
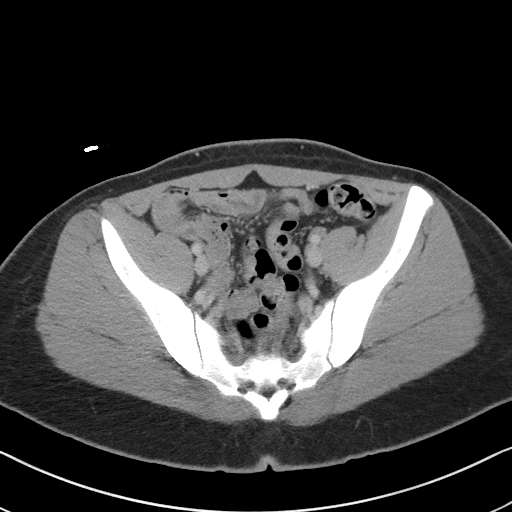
[im 32/94  soft-tissue]
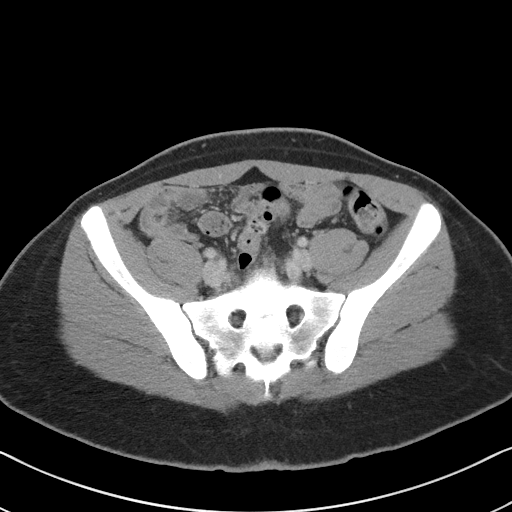
[im 39/94  soft-tissue]
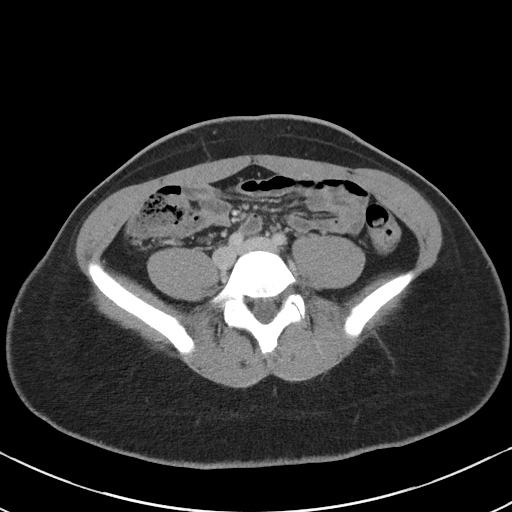
[im 47/94  soft-tissue]
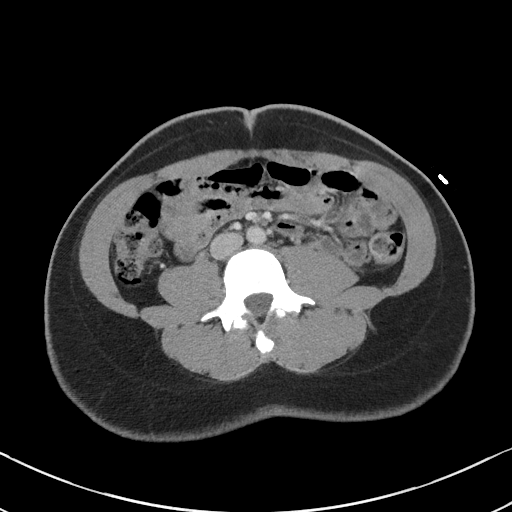
[im 55/94  soft-tissue]
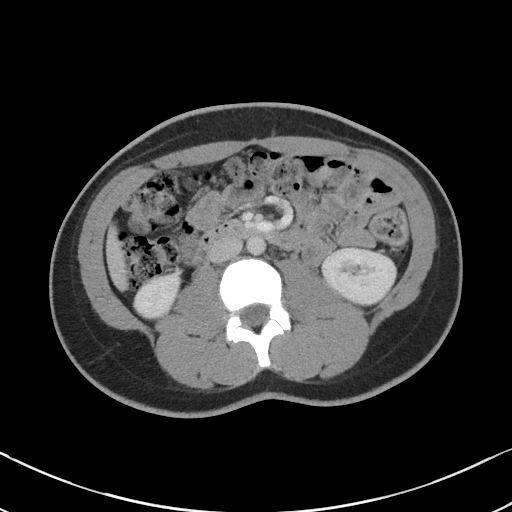
[im 63/94  soft-tissue]
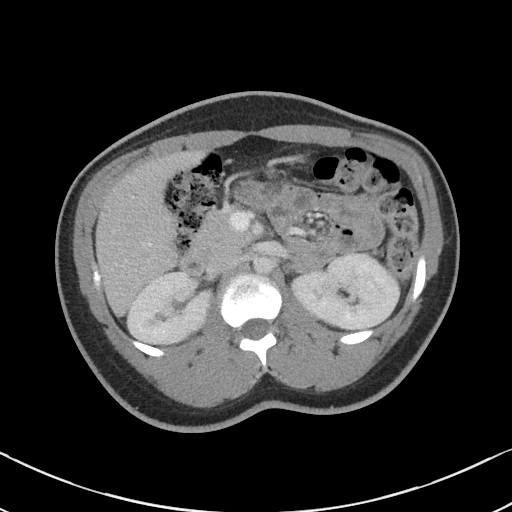
[im 63/94  bone]
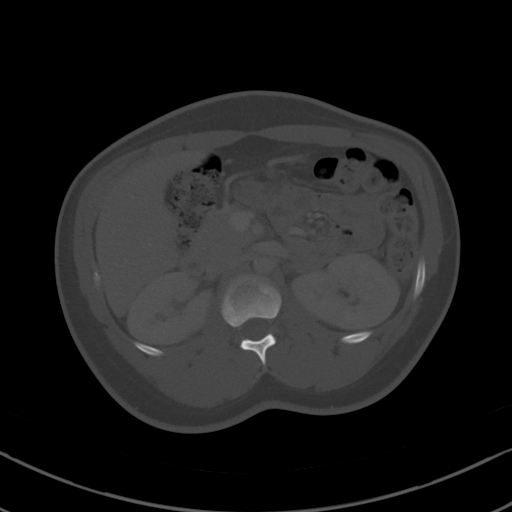
[im 66/94  soft-tissue]
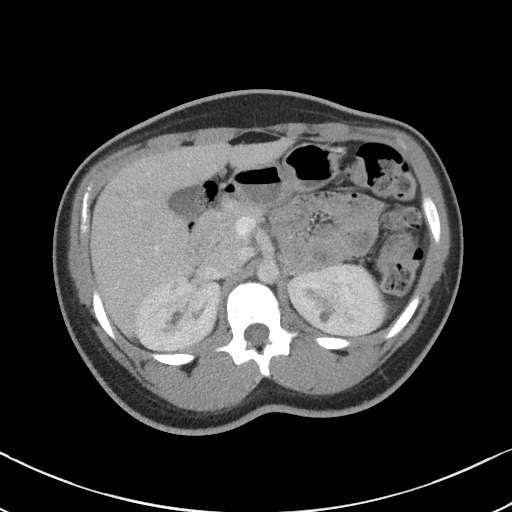
[im 74/94  soft-tissue]
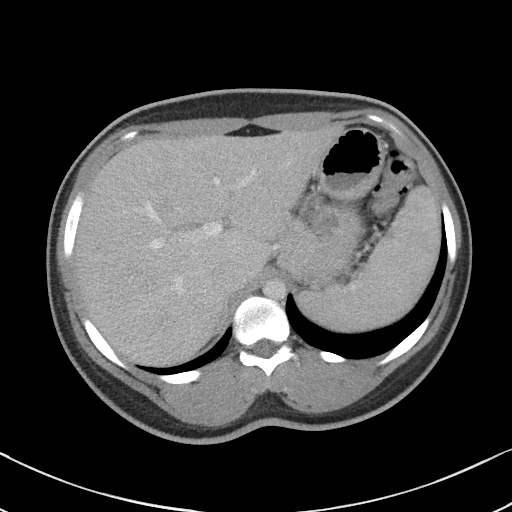
[im 82/94  soft-tissue]
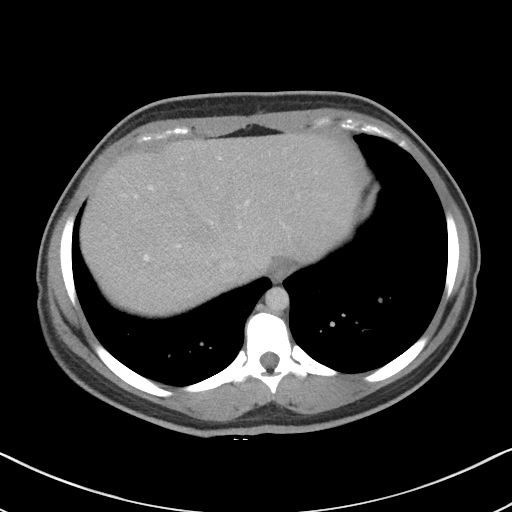
[im 90/94  soft-tissue]
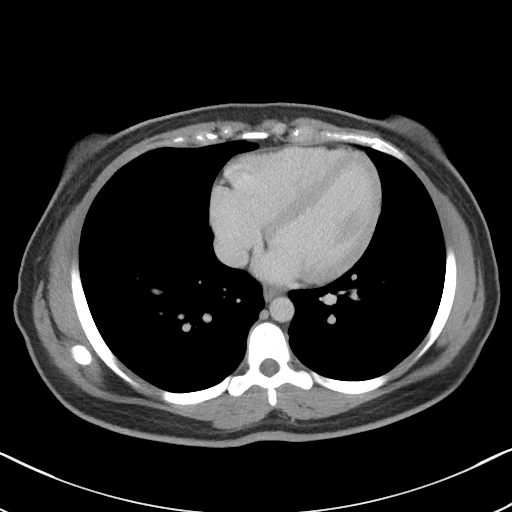

[Series 5: coronal st · coronal · 0.70mm/px · 3 of 81 slices shown]
[im 27/81  soft-tissue]
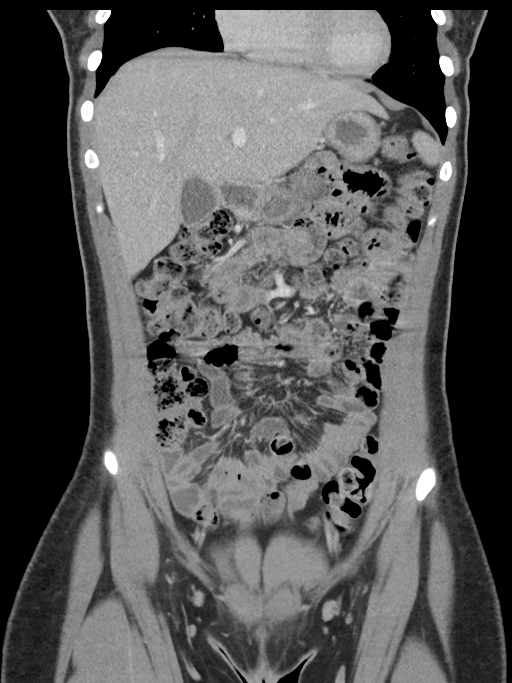
[im 36/81  soft-tissue]
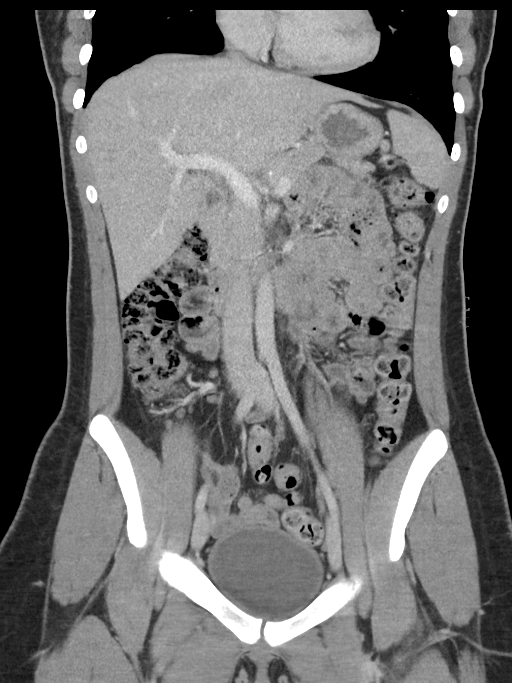
[im 45/81  soft-tissue]
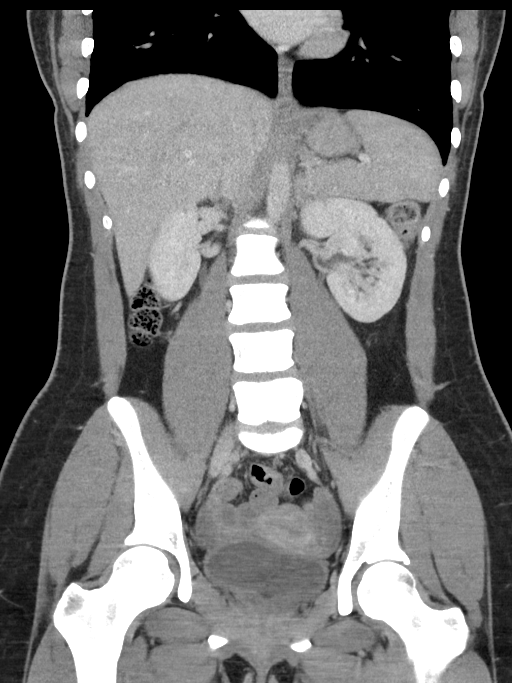

[16 of 46 positions shown; findings below may reference images not displayed]

FINDINGS: Lower chest: Lung bases are clear.

Hepatobiliary: Liver and biliary tree are unremarkable.

Pancreas: Pancreas is normal without focal lesion or ductal
dilation. No peripancreatic inflammation.

Spleen: Spleen is normal size without focal lesion.

Adrenals/Urinary Tract: Adrenal glands are normal. Small low-density
lesion in the in the upper pole the left kidney too small for
definitive characterization but likely a small cyst, similar 5 mm
lesion in the right kidney. Urinary bladder is unremarkable. No
hydronephrosis. Symmetric renal enhancement.

Stomach/Bowel: Opacity of mesenteric fat makes individual bowel
assessment difficult. No perienteric stranding. Stomach is under
distended. Is

The appendix is normal. Colon is stool filled. Stool like material
in the distal small bowel as well.

Vascular/Lymphatic: Vascular structures in the abdomen are patent.
No adenopathy in the retroperitoneum or upper abdomen.

Reproductive: CT appearance of the uterus and adnexa is
unremarkable. No pelvic lymphadenopathy.

Other: No abdominal wall hernia or abnormality. No abdominopelvic
ascites.

Musculoskeletal: No acute or significant osseous findings.
IMPRESSION: 1. No acute abnormality in the abdomen or pelvis to explain the
patient's symptoms.
2. The appendix is normal.
3. Stool like material in the distal small bowel and colon, this may
reflect delayed enteric and colonic transit/constipation.

## 2021-03-21 IMAGING — DX DG CHEST 1V PORT
1 series · 1 of 1 positions shown · non-contrast
Comparison: 07/09/2015

CLINICAL DATA: Cough and sore throat

EXAM:
PORTABLE CHEST 1 VIEW

[chest ap]
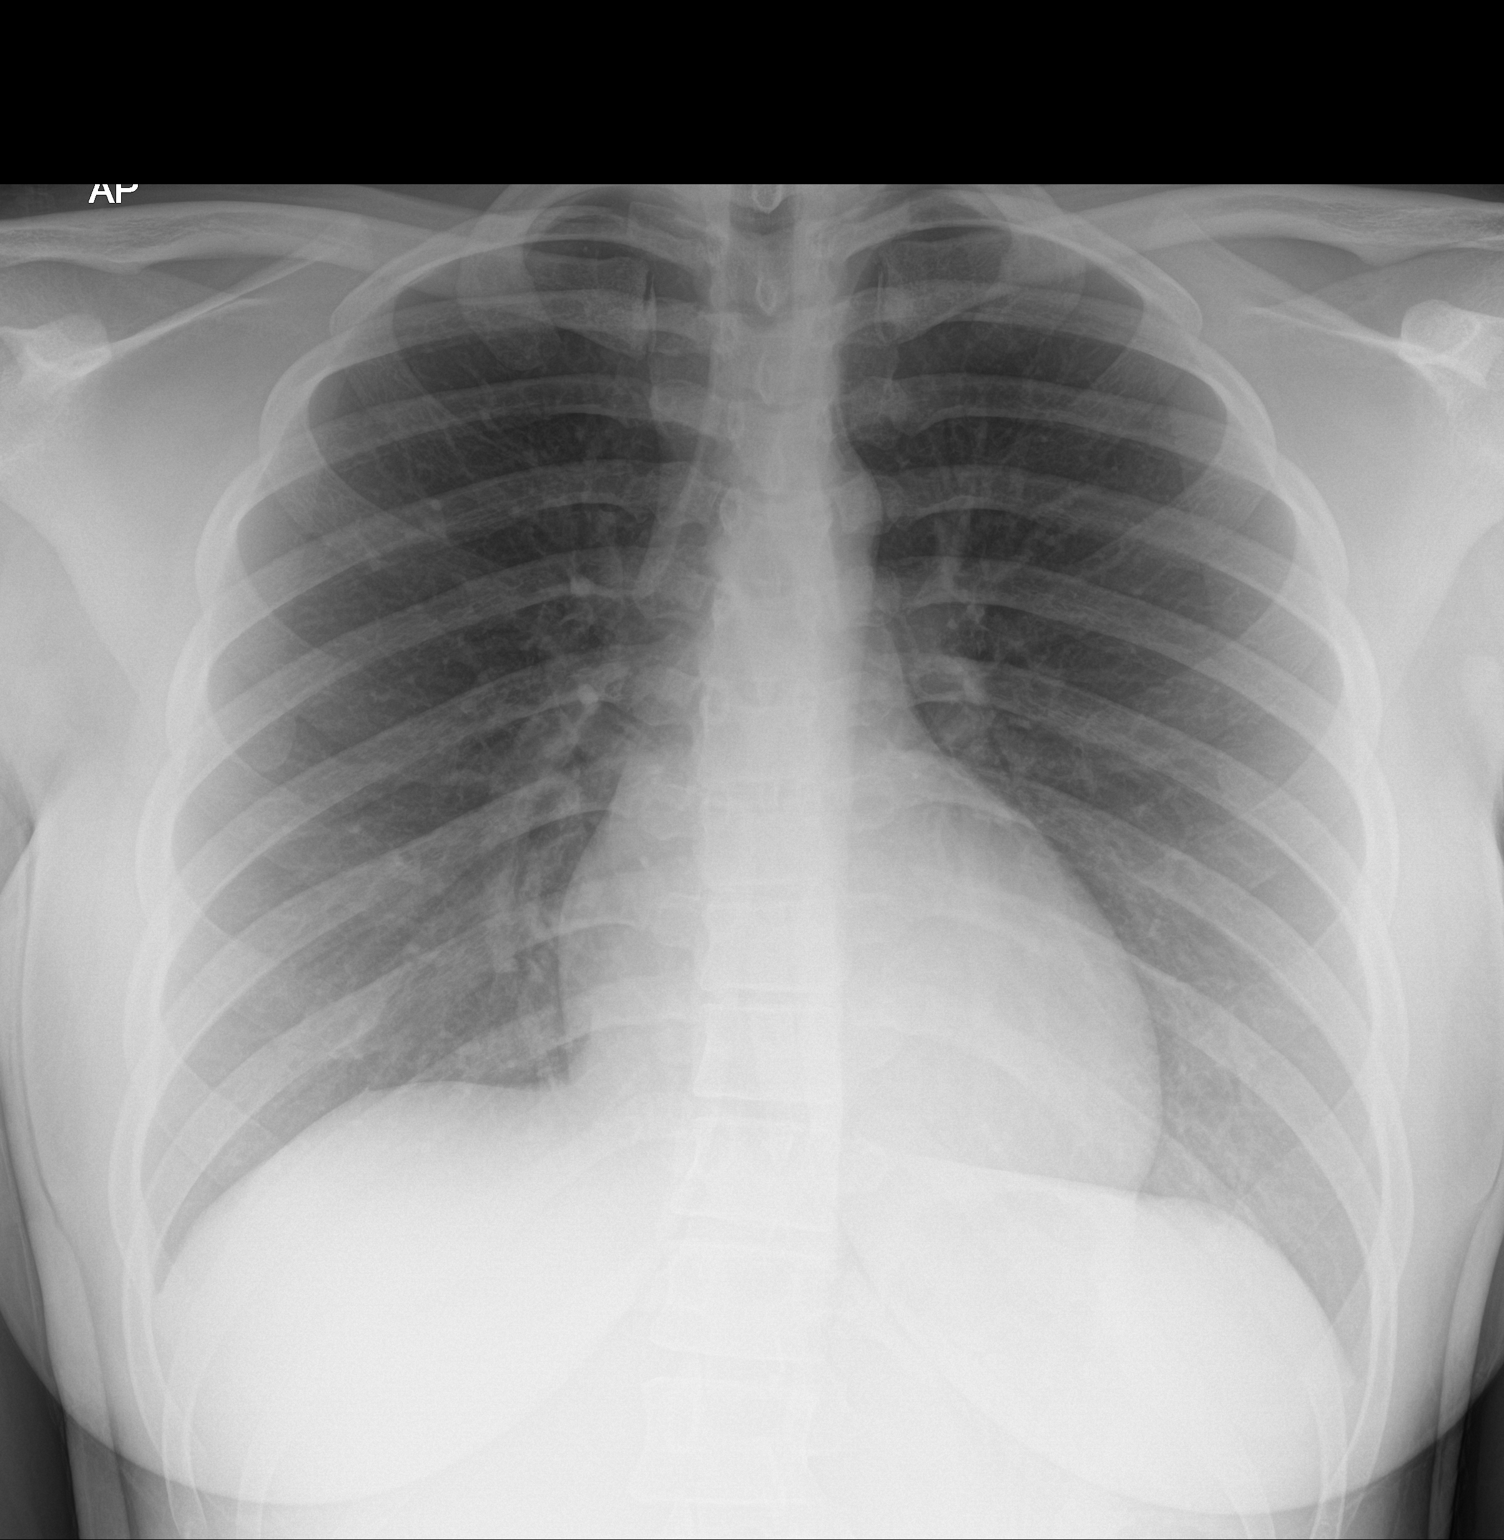

[1 of 1 positions shown; findings below may reference images not displayed]

FINDINGS: The heart size and mediastinal contours are within normal limits.
Both lungs are clear. The visualized skeletal structures are
unremarkable.
IMPRESSION: No active disease.
